# Patient Record
Sex: Male | Born: 2000 | Race: White | Hispanic: No | Marital: Single
Health system: Southern US, Community
[De-identification: ages and names within clinical notes are randomized; demographics above are authoritative.]

---
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
## 2020-05-25 ENCOUNTER — Encounter: Payer: Self-pay | Admitting: Physician Assistant

## 2020-05-25 ENCOUNTER — Other Ambulatory Visit: Payer: Self-pay

## 2020-05-25 ENCOUNTER — Emergency Department
Admission: EM | Admit: 2020-05-25 | Discharge: 2020-05-25 | Disposition: A | Discharge status: Home / Self Care | Payer: Medicaid Other | Attending: Emergency Medicine | Admitting: Emergency Medicine

## 2020-05-25 DIAGNOSIS — M7918 Myalgia, other site: Secondary | ICD-10-CM

## 2020-05-25 DIAGNOSIS — Y999 Unspecified external cause status: Secondary | ICD-10-CM | POA: Insufficient documentation

## 2020-05-25 DIAGNOSIS — S80812A Abrasion, left lower leg, initial encounter: Secondary | ICD-10-CM | POA: Diagnosis not present

## 2020-05-25 DIAGNOSIS — T07XXXA Unspecified multiple injuries, initial encounter: Secondary | ICD-10-CM

## 2020-05-25 DIAGNOSIS — Y929 Unspecified place or not applicable: Secondary | ICD-10-CM | POA: Insufficient documentation

## 2020-05-25 DIAGNOSIS — S29012A Strain of muscle and tendon of back wall of thorax, initial encounter: Secondary | ICD-10-CM | POA: Diagnosis not present

## 2020-05-25 DIAGNOSIS — Y939 Activity, unspecified: Secondary | ICD-10-CM | POA: Diagnosis not present

## 2020-05-25 MED ORDER — IBUPROFEN 800 MG PO TABS
800.0000 mg | ORAL_TABLET | Freq: Once | ORAL | Status: AC
  Administered 2020-05-25: 800 mg via ORAL
  Filled 2020-05-25: qty 1

## 2020-05-25 MED ORDER — CYCLOBENZAPRINE HCL 10 MG PO TABS
5.0000 mg | ORAL_TABLET | Freq: Once | ORAL | Status: AC
  Administered 2020-05-25: 5 mg via ORAL
  Filled 2020-05-25: qty 1

## 2020-05-25 MED ORDER — IBUPROFEN 800 MG PO TABS
800.0000 mg | ORAL_TABLET | Freq: Three times a day (TID) | ORAL | 0 refills | Status: AC | PRN
Start: 2020-05-25 — End: ?

## 2020-05-25 MED ORDER — CYCLOBENZAPRINE HCL 5 MG PO TABS
5.0000 mg | ORAL_TABLET | Freq: Three times a day (TID) | ORAL | 0 refills | Status: AC | PRN
Start: 2020-05-25 — End: ?
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
## 2023-05-10 ENCOUNTER — Other Ambulatory Visit: Payer: Self-pay | Admitting: Internal Medicine

## 2023-05-10 ENCOUNTER — Ambulatory Visit
Admission: RE | Admit: 2023-05-10 | Discharge: 2023-05-10 | Disposition: A | Discharge status: Home / Self Care | Payer: Medicaid Other | Source: Ambulatory Visit | Attending: Internal Medicine | Admitting: Internal Medicine

## 2023-05-10 DIAGNOSIS — N50811 Right testicular pain: Secondary | ICD-10-CM | POA: Diagnosis present
